# Patient Record
Sex: Male | Born: 1985 | Race: White | Hispanic: No | Smoking: Never smoker
Health system: Southern US, Community
[De-identification: ages and names within clinical notes are randomized; demographics above are authoritative.]

---
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
## 2018-06-04 ENCOUNTER — Encounter (HOSPITAL_COMMUNITY): Payer: Self-pay | Admitting: Emergency Medicine

## 2018-06-04 ENCOUNTER — Other Ambulatory Visit: Payer: Self-pay

## 2018-06-04 ENCOUNTER — Emergency Department (HOSPITAL_COMMUNITY): Payer: BC Managed Care – PPO

## 2018-06-04 ENCOUNTER — Emergency Department (HOSPITAL_COMMUNITY)
Admission: EM | Admit: 2018-06-04 | Discharge: 2018-06-04 | Disposition: A | Discharge status: Home / Self Care | Payer: BC Managed Care – PPO | Attending: Emergency Medicine | Admitting: Emergency Medicine

## 2018-06-04 DIAGNOSIS — S42021A Displaced fracture of shaft of right clavicle, initial encounter for closed fracture: Secondary | ICD-10-CM | POA: Diagnosis not present

## 2018-06-04 DIAGNOSIS — Y999 Unspecified external cause status: Secondary | ICD-10-CM | POA: Insufficient documentation

## 2018-06-04 DIAGNOSIS — Y929 Unspecified place or not applicable: Secondary | ICD-10-CM | POA: Diagnosis not present

## 2018-06-04 DIAGNOSIS — M25511 Pain in right shoulder: Secondary | ICD-10-CM | POA: Diagnosis present

## 2018-06-04 DIAGNOSIS — Y9355 Activity, bike riding: Secondary | ICD-10-CM | POA: Insufficient documentation

## 2018-06-04 MED ORDER — HYDROCODONE-ACETAMINOPHEN 5-325 MG PO TABS: 1.0000 | ORAL_TABLET | Freq: Four times a day (QID) | ORAL | 0 refills | Status: AC | PRN

## 2018-06-04 MED ORDER — DICLOFENAC SODIUM 1 % TD GEL: 4.0000 g | Freq: Four times a day (QID) | TRANSDERMAL | 0 refills | Status: AC

## 2018-06-04 NOTE — ED Notes (Signed)
Patient transported to MRI 
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
## 2018-06-04 NOTE — Discharge Instructions (Signed)
There was a fracture noted to the right clavicle (collarbone). Antiinflammatory medications: Take 600 mg of ibuprofen every 6 hours or 440 mg (over the counter dose) to 500 mg (prescription dose) of naproxen every 12 hours for the next 3 days. After this time, these medications may be used as needed for pain. Take these medications with food to avoid upset stomach. Choose only one of these medications, do not take them together. Acetaminophen (generic for Tylenol): Should you continue to have additional pain while taking the ibuprofen or naproxen, you may add in acetaminophen as needed. Your daily total maximum amount of acetaminophen from all sources should be limited to 4000mg /day for persons without liver problems, or 2000mg /day for those with liver problems. Vicodin: May take Vicodin (hydrocodone-acetaminophen) as needed for severe pain.  Do not drive or perform other dangerous activities while taking the Vicodin.  Please note that each pill of Vicodin contains 325 mg of acetaminophen (Tylenol) and the above dosage limits apply. Diclofenac gel: This is a topical anti-inflammatory medication and can be applied directly to the painful region.  Do not use on the face or genitals.  This medication may be used as an alternative to oral anti-inflammatory medications, such as ibuprofen or naproxen. Ice: May apply ice to the area over the next 24 hours for 15 minutes at a time to reduce swelling. Elevation: Keep the shoulder elevated as often as possible to reduce pain and inflammation.  This may mean sleeping upright. Support: Wear the sling for support and comfort. Wear this until pain resolves.  Follow up: Follow-up with the orthopedic specialist.  Call the number provided to set up an appointment. Return: Return to the ED for numbness, weakness, increasing pain, overall worsening symptoms, loss of function, or if symptoms are not improving, you have tried to follow up with the orthopedic specialist, and  have been unable to do so.  For prescription assistance, may try using prescription discount sites or apps, such as goodrx.com
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
## 2018-06-04 NOTE — ED Notes (Signed)
Discharge instructions and prescriptions discussed with Pt. Pt verbalized understanding. Pt stable and ambulatory.   

## 2019-01-15 ENCOUNTER — Other Ambulatory Visit: Payer: Self-pay

## 2019-01-15 DIAGNOSIS — Z20822 Contact with and (suspected) exposure to covid-19: Secondary | ICD-10-CM

## 2019-01-16 LAB — NOVEL CORONAVIRUS, NAA: SARS-CoV-2, NAA: NOT DETECTED

## 2020-11-05 IMAGING — CR RIGHT SHOULDER - 2+ VIEW
2 series · 2 of 2 positions shown · non-contrast
Comparison: None.

CLINICAL DATA: Pain after trauma

EXAM:
RIGHT SHOULDER - 2+ VIEW

[shoulder grashey]
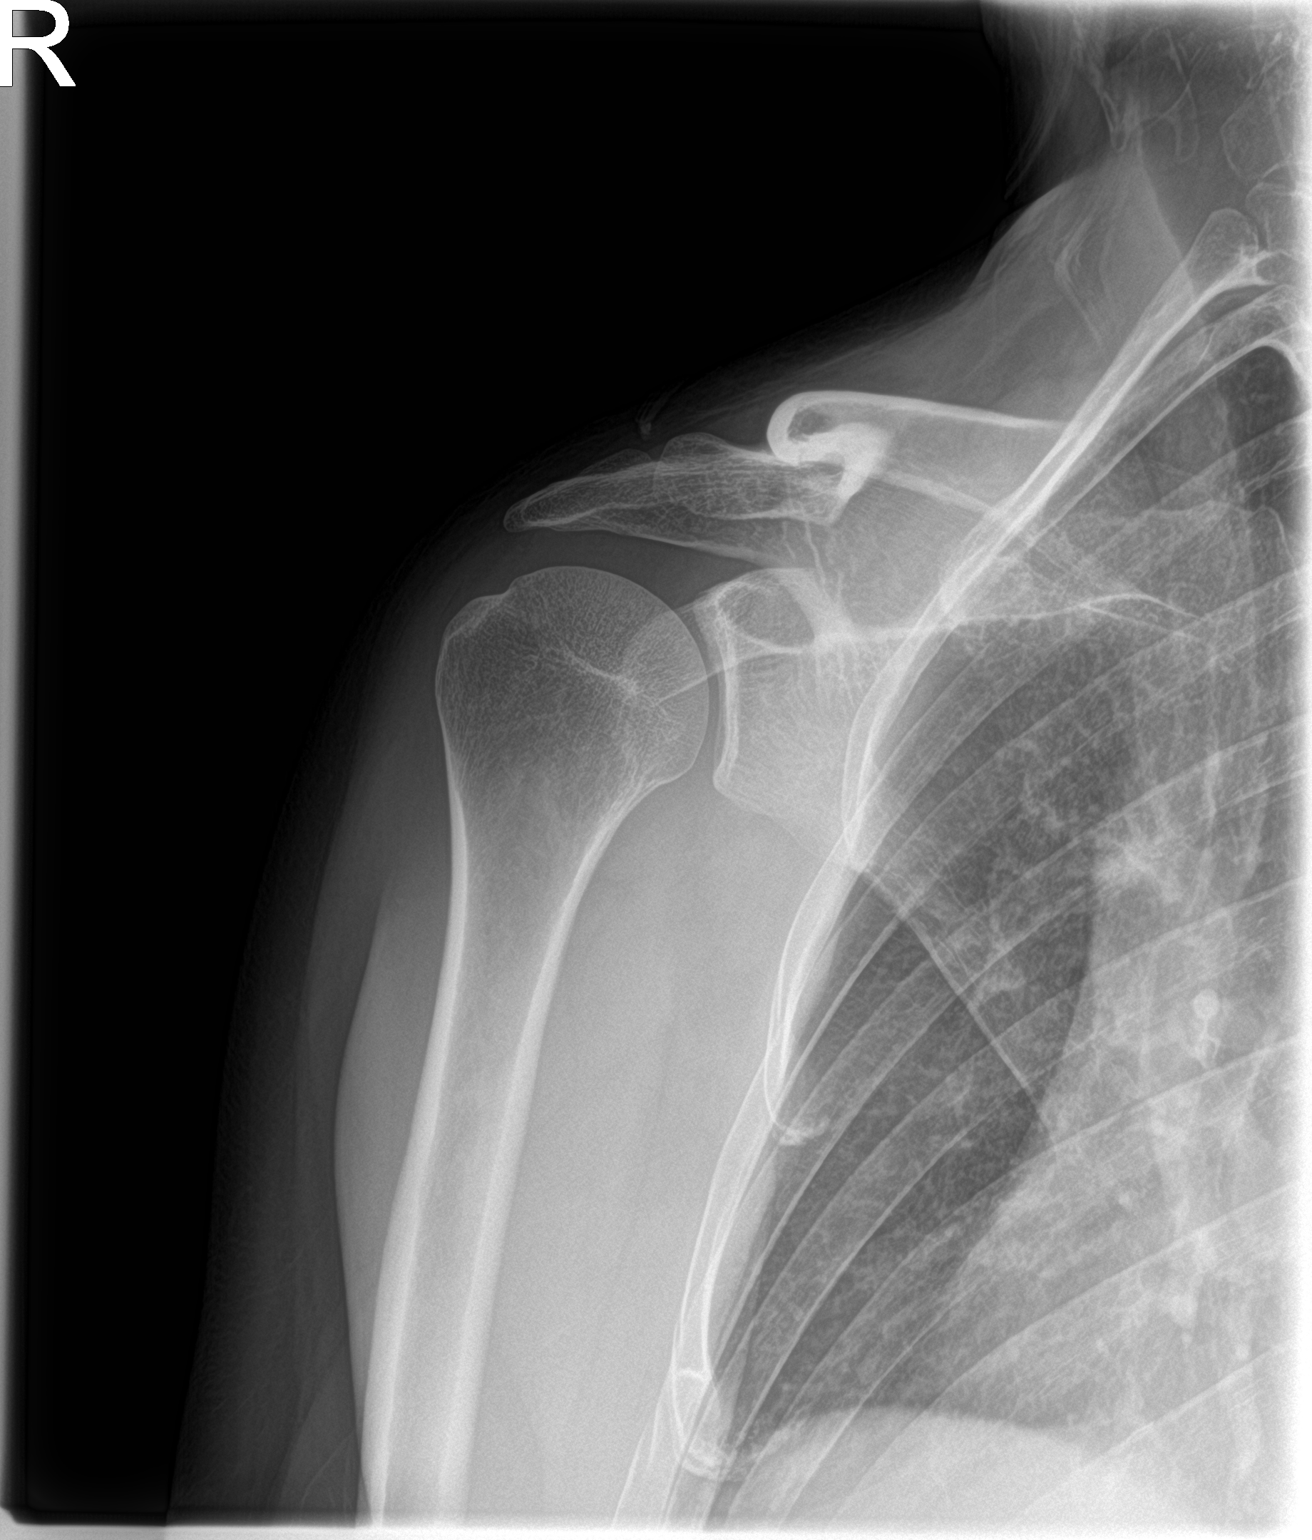

[shoulder y view]
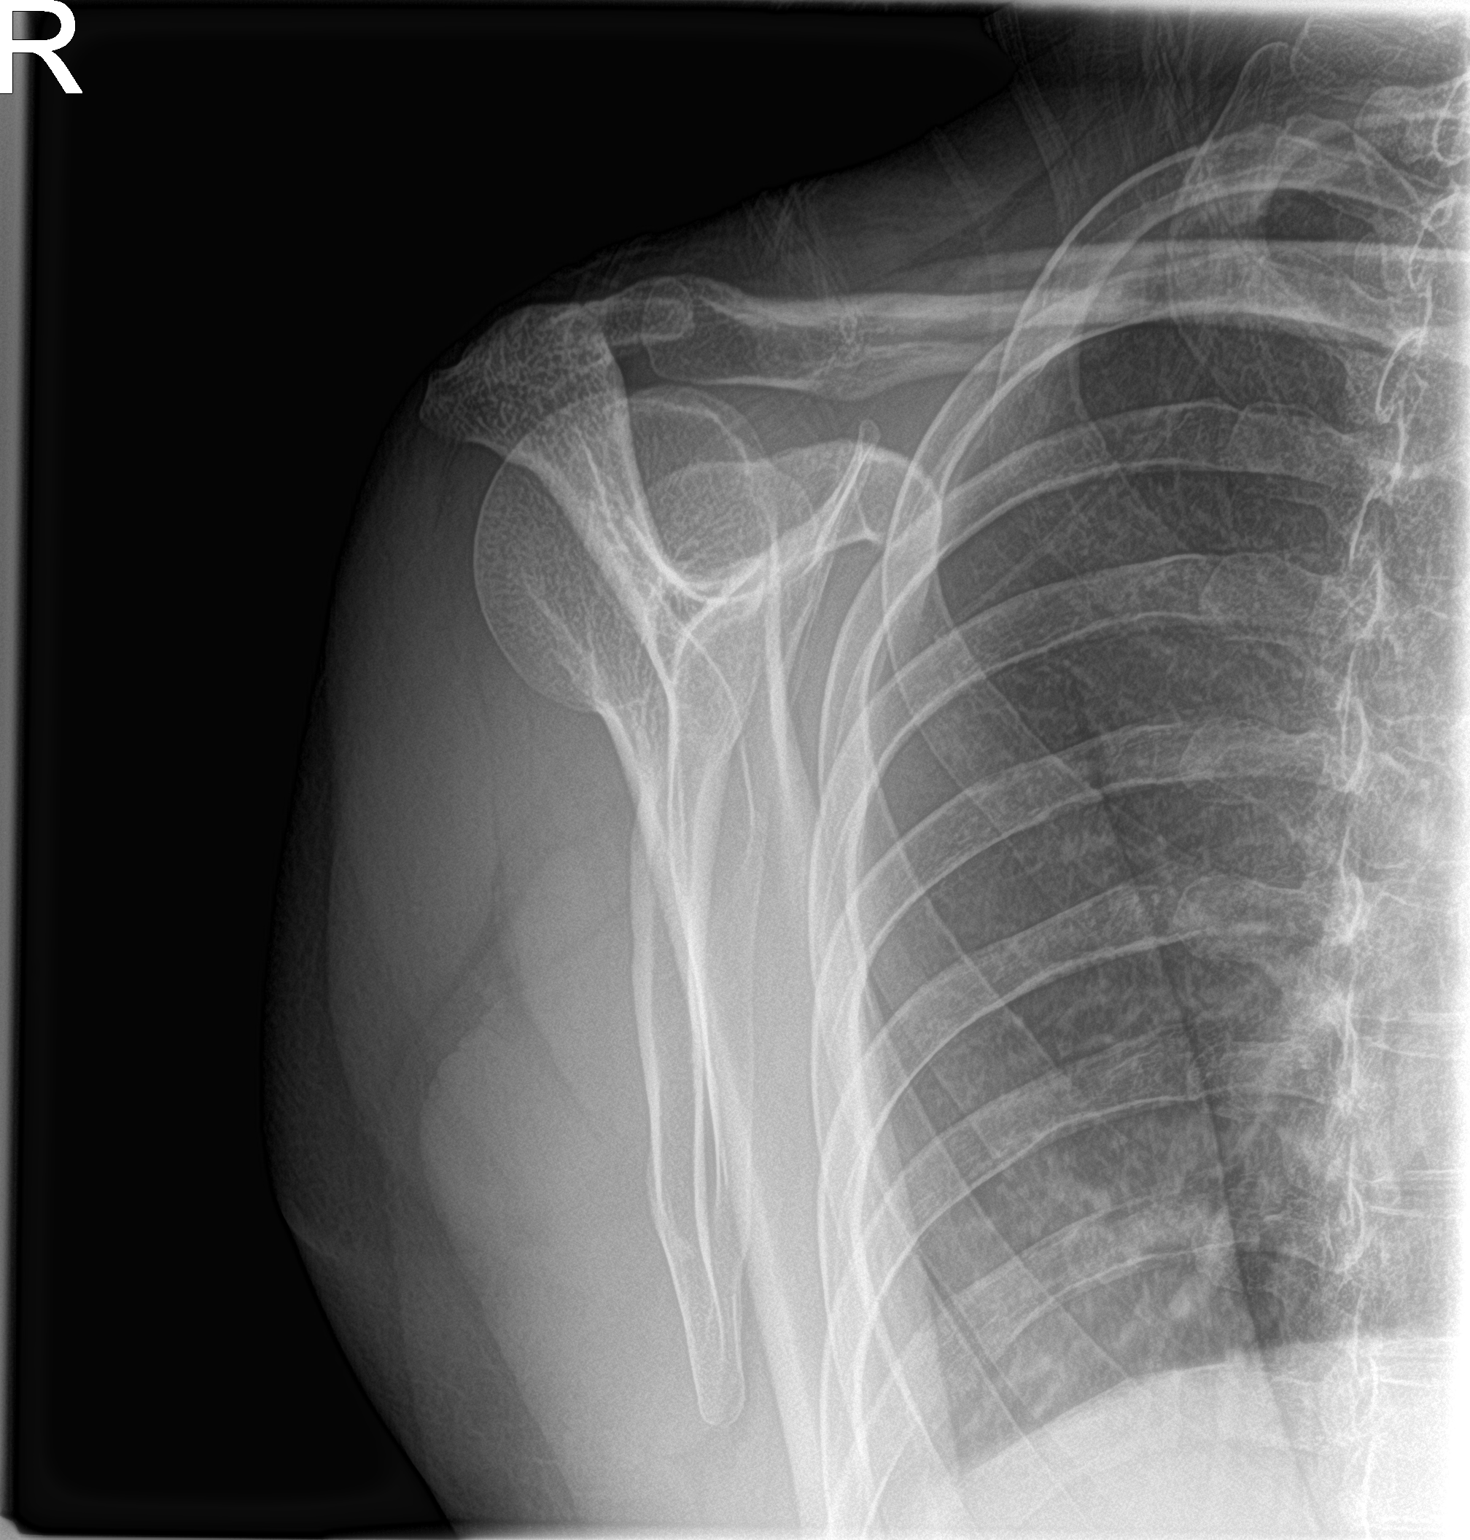

[2 of 2 positions shown; findings below may reference images not displayed]

FINDINGS: There is a fracture through the clavicular shaft which will be
discussed on the dedicated clavicular films. No humeral or scapular
fractures identified. No dislocation in the shoulder.
IMPRESSION: Right clavicular fracture. See the clavicular films dictation for
further discussion. No right shoulder fracture or dislocation.
